# Patient Record
Sex: Male | Born: 2000 | Race: Black or African American | Hispanic: No | Marital: Single | Smoking: Never smoker
Health system: Southern US, Community
[De-identification: ages and names within clinical notes are randomized; demographics above are authoritative.]

## PROBLEM LIST (undated history)

## (undated) DIAGNOSIS — J3089 Other allergic rhinitis: Secondary | ICD-10-CM

## (undated) DIAGNOSIS — S060X9A Concussion with loss of consciousness of unspecified duration, initial encounter: Secondary | ICD-10-CM

## (undated) DIAGNOSIS — S060XAA Concussion with loss of consciousness status unknown, initial encounter: Secondary | ICD-10-CM

## (undated) DIAGNOSIS — Z91018 Allergy to other foods: Secondary | ICD-10-CM

---
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
## 2024-01-03 ENCOUNTER — Emergency Department (HOSPITAL_BASED_OUTPATIENT_CLINIC_OR_DEPARTMENT_OTHER)
Admission: EM | Admit: 2024-01-03 | Discharge: 2024-01-03 | Disposition: A | Discharge status: Home / Self Care | Attending: Emergency Medicine | Admitting: Emergency Medicine

## 2024-01-03 ENCOUNTER — Emergency Department (HOSPITAL_BASED_OUTPATIENT_CLINIC_OR_DEPARTMENT_OTHER)

## 2024-01-03 ENCOUNTER — Encounter (HOSPITAL_BASED_OUTPATIENT_CLINIC_OR_DEPARTMENT_OTHER): Payer: Self-pay | Admitting: Emergency Medicine

## 2024-01-03 DIAGNOSIS — S93491A Sprain of other ligament of right ankle, initial encounter: Secondary | ICD-10-CM | POA: Diagnosis not present

## 2024-01-03 DIAGNOSIS — Y9367 Activity, basketball: Secondary | ICD-10-CM | POA: Diagnosis not present

## 2024-01-03 DIAGNOSIS — S99911A Unspecified injury of right ankle, initial encounter: Secondary | ICD-10-CM | POA: Diagnosis present

## 2024-01-03 DIAGNOSIS — W500XXA Accidental hit or strike by another person, initial encounter: Secondary | ICD-10-CM | POA: Diagnosis not present

## 2024-01-03 DIAGNOSIS — S93401A Sprain of unspecified ligament of right ankle, initial encounter: Secondary | ICD-10-CM

## 2024-01-03 NOTE — ED Notes (Signed)
 Pt has returned to their room

## 2024-01-03 NOTE — ED Triage Notes (Signed)
 Pt playing basket ball and landed on someone else's foot. Pain and swelling to right ankle, about 1900 tonight.

## 2024-01-03 NOTE — ED Provider Notes (Signed)
 [facility] Provider Note   CSN: [id number] Arrival date & time: 01/03/24  2153     History  Chief Complaint  Patient presents with   Ankle Pain    Kenneth Clements is a 23 y.o. male.  Who presents to the ED for right ankle injury.  Patient was playing basketball and landed on top of another player's foot with his right foot suffering an inversion injury of the right ankle.  Now with pain and swelling over the right lateral aspect of his ankle.  No other injuries   Ankle Pain      Home Medications Prior to Admission medications   Not on File      Allergies    Other    Review of Systems   Review of Systems  Physical Exam Updated Vital Signs BP 124/71 (BP Location: Right Arm)   Pulse 77   Temp 99.5 F (37.5 C) (Oral)   Resp 18   Ht 6' (1.829 m)   Wt 80.3 kg   SpO2 96%   BMI 24.01 kg/m  Physical Exam Vitals and nursing note reviewed.  HENT:     Head: Normocephalic and atraumatic.  Eyes:     Pupils: Pupils are equal, round, and reactive to light.  Cardiovascular:     Rate and Rhythm: Normal rate and regular rhythm.  Pulmonary:     Effort: Pulmonary effort is normal.     Breath sounds: Normal breath sounds.  Abdominal:     Palpations: Abdomen is soft.     Tenderness: There is no abdominal tenderness.  Musculoskeletal:     Comments: Bony tenderness over right lateral malleolus with swelling over this region and over the proximal dorsal aspect of the right foot 5 out of 5 motor strength throughout right foot and ankle No tenderness of the knee or proximal right lower extremity 2+ radial pulse bilaterally  Skin:    General: Skin is warm and dry.  Neurological:     Mental Status: He is alert.  Psychiatric:        Mood and Affect: Mood normal.     ED Results / Procedures / Treatments   Labs (all labs ordered are listed, but only abnormal results are displayed) Labs Reviewed - No data to  display  EKG None  Radiology DG Ankle Complete Right Result Date: 01/03/2024 CLINICAL DATA:  Status post trauma. EXAM: RIGHT ANKLE - COMPLETE 3+ VIEW COMPARISON:  November 10, 2019 FINDINGS: There is no evidence of an acute fracture or dislocation. Mild chronic and degenerative changes are seen along the dorsal aspect of the proximal right foot. Moderate severity anterior and lateral soft tissue swelling is noted. IMPRESSION: Moderate severity anterior and lateral soft tissue swelling without evidence of an acute osseous abnormality. Electronically Signed   By: Virgle Grime M.D.   On: 01/03/2024 22:23    Procedures Procedures    Medications Ordered in ED Medications - No data to display  ED Course/ Medical Decision Making/ A&P                                 Medical Decision Making 23 year old male here for right ankle injury sustained while playing bass ball.  Inversion injury.  Swelling tenderness over right lateral malleolus and additional swelling over the dorsal aspect of the right foot.  X-ray shows no osseous injury.  Suspect ankle sprain.  Provided him  with Ace wrap and lace up ankle boot.  Instructed on symptomatic management with rest ice compression elevation and NSAIDs.  Will instruct for orthopedic follow-up if symptoms persist or worsen  Amount and/or Complexity of Data Reviewed Radiology: ordered.           Final Clinical Impression(s) / ED Diagnoses Final diagnoses:  Sprain of right ankle, unspecified ligament, initial encounter    Rx / DC Orders ED Discharge Orders     None         Sallyanne Creamer, DO 01/03/24 2321

## 2024-01-03 NOTE — Discharge Instructions (Signed)
 You were seen in the emergency department for a right ankle injury while playing basketball There is no broken bones on the x-ray This is most likely a bad ankle sprain For this reason we placed you in a right ankle brace Continue elevating applying ice packs and taking Motrin as directed to help with the pain and inflammation Use the crutches to walk around until your pain is better If your pain is not improved please follow-up with Dr. Maryjane Snider who is an orthopedic specialist at the number listed above
